# Patient Record
Sex: Female | Born: 1995 | Race: White | Hispanic: No | Marital: Single | Smoking: Never smoker
Health system: Southern US, Community
[De-identification: ages and names within clinical notes are randomized; demographics above are authoritative.]

---

## 2017-01-07 ENCOUNTER — Ambulatory Visit (HOSPITAL_COMMUNITY)
Admission: EM | Admit: 2017-01-07 | Discharge: 2017-01-07 | Disposition: A | Discharge status: Home / Self Care | Payer: BLUE CROSS/BLUE SHIELD | Attending: Family Medicine | Admitting: Family Medicine

## 2017-01-07 ENCOUNTER — Encounter (HOSPITAL_COMMUNITY): Payer: Self-pay | Admitting: *Deleted

## 2017-01-07 DIAGNOSIS — J00 Acute nasopharyngitis [common cold]: Secondary | ICD-10-CM

## 2017-01-07 DIAGNOSIS — J40 Bronchitis, not specified as acute or chronic: Secondary | ICD-10-CM

## 2017-01-07 DIAGNOSIS — R05 Cough: Secondary | ICD-10-CM | POA: Diagnosis not present

## 2017-01-07 DIAGNOSIS — R059 Cough, unspecified: Secondary | ICD-10-CM

## 2017-01-07 MED ORDER — AZITHROMYCIN 250 MG PO TABS: 250.0000 mg | ORAL_TABLET | Freq: Every day | ORAL | 0 refills | Status: AC

## 2017-01-07 MED ORDER — IPRATROPIUM BROMIDE 0.06 % NA SOLN: 2.0000 | Freq: Four times a day (QID) | NASAL | 12 refills | Status: AC

## 2017-01-07 MED ORDER — BENZONATATE 100 MG PO CAPS: 200.0000 mg | ORAL_CAPSULE | Freq: Three times a day (TID) | ORAL | 0 refills | Status: AC | PRN

## 2017-01-07 NOTE — ED Provider Notes (Signed)
CSN: [id number]     Arrival date & time 01/07/17  1421 History   First MD Initiated Contact with Patient 01/07/17 1626     Chief Complaint  Patient presents with  . URI   (Consider location/radiation/quality/duration/timing/severity/associated sxs/prior Treatment) Patient c/o uri sx's and cough for over 5 days.   The history is provided by the patient.  URI  Presenting symptoms: congestion, cough and fatigue   Severity:  Moderate Onset quality:  Sudden Duration:  5 days Timing:  Constant Progression:  Worsening Chronicity:  New Relieved by:  Nothing Worsened by:  Nothing Ineffective treatments:  None tried   History reviewed. No pertinent past medical history. History reviewed. No pertinent surgical history. No family history on file. Social History  Substance Use Topics  . Smoking status: Never Smoker  . Smokeless tobacco: Never Used  . Alcohol use Yes   OB History    No data available     Review of Systems  Constitutional: Positive for fatigue.  HENT: Positive for congestion.   Eyes: Negative.   Respiratory: Positive for cough.   Cardiovascular: Negative.   Gastrointestinal: Negative.   Endocrine: Negative.   Genitourinary: Negative.   Musculoskeletal: Negative.   Skin: Negative.   Allergic/Immunologic: Negative.   Neurological: Negative.   Hematological: Negative.   Psychiatric/Behavioral: Negative.     Allergies  Patient has no known allergies.  Home Medications   Prior to Admission medications   Medication Sig Start Date End Date Taking? Authorizing Provider  azithromycin (ZITHROMAX) 250 MG tablet Take 1 tablet (250 mg total) by mouth daily. Take first 2 tablets together, then 1 every day until finished. 01/07/17   Deatra Canter, FNP  benzonatate (TESSALON) 100 MG capsule Take 2 capsules (200 mg total) by mouth 3 (three) times daily as needed for cough. 01/07/17   Deatra Canter, FNP  ipratropium (ATROVENT) 0.06 % nasal spray Place 2 sprays into  both nostrils 4 (four) times daily. 01/07/17   Deatra Canter, FNP   Meds Ordered and Administered this Visit  Medications - No data to display  BP 112/73 (BP Location: Right Arm)   Pulse 93   Temp 98.6 F (37 C) (Oral)   Resp 18   LMP 12/24/2016   SpO2 98%  No data found.   Physical Exam  Constitutional: She appears well-developed and well-nourished.  HENT:  Head: Normocephalic and atraumatic.  Right Ear: External ear normal.  Left Ear: External ear normal.  Mouth/Throat: Oropharynx is clear and moist.  Eyes: Conjunctivae and EOM are normal. Pupils are equal, round, and reactive to light.  Neck: Normal range of motion. Neck supple.  Cardiovascular: Normal rate, regular rhythm and normal heart sounds.   Pulmonary/Chest: Effort normal and breath sounds normal.  Abdominal: Soft. Bowel sounds are normal.  Nursing note and vitals reviewed.   Urgent Care Course   Clinical Course     Procedures (including critical care time)  Labs Review Labs Reviewed - No data to display  Imaging Review No results found.   Visual Acuity Review  Right Eye Distance:   Left Eye Distance:   Bilateral Distance:    Right Eye Near:   Left Eye Near:    Bilateral Near:         MDM   1. Acute nasopharyngitis   2. Bronchitis   3. Cough    Zpak Tessalon atrovent nasal spray  Push po fluids, rest, tylenol and motrin otc prn as directed for fever, arthralgias, and  myalgias.  Follow up prn if sx's continue or persist.    Deatra CanterWilliam J Gladys Deckard, FNP 01/07/17 [phone]

## 2017-01-07 NOTE — ED Triage Notes (Signed)
Pt  Reports  5  Days    Of  Nasal  Congestion   Runny  Nose          Congestion  And   Chils    Symptoms  Not  releived  By  otc  meds   And  netti  Pot

## 2017-07-26 ENCOUNTER — Emergency Department (HOSPITAL_COMMUNITY): Payer: BLUE CROSS/BLUE SHIELD

## 2017-07-26 ENCOUNTER — Emergency Department (HOSPITAL_COMMUNITY)
Admission: EM | Admit: 2017-07-26 | Discharge: 2017-07-26 | Disposition: A | Discharge status: Home / Self Care | Payer: BLUE CROSS/BLUE SHIELD | Attending: Emergency Medicine | Admitting: Emergency Medicine

## 2017-07-26 ENCOUNTER — Encounter (HOSPITAL_COMMUNITY): Payer: Self-pay

## 2017-07-26 DIAGNOSIS — R51 Headache: Secondary | ICD-10-CM | POA: Diagnosis not present

## 2017-07-26 DIAGNOSIS — Z79899 Other long term (current) drug therapy: Secondary | ICD-10-CM | POA: Diagnosis not present

## 2017-07-26 DIAGNOSIS — M542 Cervicalgia: Secondary | ICD-10-CM | POA: Diagnosis present

## 2017-07-26 DIAGNOSIS — M791 Myalgia: Secondary | ICD-10-CM | POA: Diagnosis not present

## 2017-07-26 DIAGNOSIS — Y939 Activity, unspecified: Secondary | ICD-10-CM | POA: Insufficient documentation

## 2017-07-26 DIAGNOSIS — Y999 Unspecified external cause status: Secondary | ICD-10-CM | POA: Diagnosis not present

## 2017-07-26 DIAGNOSIS — Y92410 Unspecified street and highway as the place of occurrence of the external cause: Secondary | ICD-10-CM | POA: Diagnosis not present

## 2017-07-26 DIAGNOSIS — M7918 Myalgia, other site: Secondary | ICD-10-CM

## 2017-07-26 MED ORDER — IBUPROFEN 800 MG PO TABS
800.0000 mg | ORAL_TABLET | Freq: Once | ORAL | Status: AC
  Administered 2017-07-26: 800 mg via ORAL
  Filled 2017-07-26: qty 1

## 2017-07-26 MED ORDER — METHOCARBAMOL 500 MG PO TABS: 500.0000 mg | ORAL_TABLET | Freq: Two times a day (BID) | ORAL | 0 refills | Status: AC

## 2017-07-26 MED ORDER — IBUPROFEN 800 MG PO TABS: 800.0000 mg | ORAL_TABLET | Freq: Three times a day (TID) | ORAL | 0 refills | Status: AC | PRN
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
## 2017-07-26 NOTE — ED Notes (Signed)
PT states understanding of care given, follow up care, and medication prescribed. PT ambulated from ED to car with a steady gait. 

## 2017-07-26 NOTE — Discharge Instructions (Signed)
Use ibuprofen up to 3 times a day as needed for pain. Take this with meals. Do not take other anti-inflammatories at the same time (Advil, naproxen, Aleve, Motrin). You may supplement with Tylenol as needed for further pain control. You may use the muscle relaxant up to twice a day as needed for muscle stiffness and soreness. You will likely have continued muscle pain and stiffness of the next several days. Follow-up with primary care in 1 week if you are having continued pain. Return to the emergency room if you have vision changes, confusion, vomiting, numbness, tingling, or any new or worsening symptoms.
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
